# Patient Record
Sex: Female | Born: 1945 | Hispanic: No | Marital: Married
Health system: Southern US, Community
[De-identification: ages and names within clinical notes are randomized; demographics above are authoritative.]

---

## 1998-04-30 ENCOUNTER — Other Ambulatory Visit: Admission: RE | Admit: 1998-04-30 | Discharge: 1998-04-30 | Payer: Self-pay | Admitting: Obstetrics and Gynecology

## 1998-05-01 ENCOUNTER — Other Ambulatory Visit: Admission: RE | Admit: 1998-05-01 | Discharge: 1998-05-01 | Payer: Self-pay | Admitting: Obstetrics and Gynecology

## 2002-11-14 ENCOUNTER — Other Ambulatory Visit: Admission: RE | Admit: 2002-11-14 | Discharge: 2002-11-14 | Payer: Self-pay | Admitting: Obstetrics and Gynecology

## 2004-02-03 ENCOUNTER — Other Ambulatory Visit: Admission: RE | Admit: 2004-02-03 | Discharge: 2004-02-03 | Payer: Self-pay | Admitting: Obstetrics and Gynecology

## 2005-02-02 ENCOUNTER — Other Ambulatory Visit: Admission: RE | Admit: 2005-02-02 | Discharge: 2005-02-02 | Payer: Self-pay | Admitting: Obstetrics and Gynecology

## 2015-03-05 ENCOUNTER — Other Ambulatory Visit: Payer: Self-pay | Admitting: Obstetrics and Gynecology

## 2015-03-05 DIAGNOSIS — R928 Other abnormal and inconclusive findings on diagnostic imaging of breast: Secondary | ICD-10-CM

## 2015-03-10 ENCOUNTER — Ambulatory Visit
Admission: RE | Admit: 2015-03-10 | Discharge: 2015-03-10 | Disposition: A | Discharge status: Home / Self Care | Payer: BLUE CROSS/BLUE SHIELD | Source: Ambulatory Visit | Attending: Obstetrics and Gynecology | Admitting: Obstetrics and Gynecology

## 2015-03-10 DIAGNOSIS — R928 Other abnormal and inconclusive findings on diagnostic imaging of breast: Secondary | ICD-10-CM

## 2016-03-02 ENCOUNTER — Other Ambulatory Visit: Payer: Self-pay | Admitting: Obstetrics & Gynecology

## 2016-03-02 DIAGNOSIS — E2839 Other primary ovarian failure: Secondary | ICD-10-CM

## 2016-03-02 DIAGNOSIS — N63 Unspecified lump in unspecified breast: Secondary | ICD-10-CM

## 2016-03-17 ENCOUNTER — Ambulatory Visit
Admission: RE | Admit: 2016-03-17 | Discharge: 2016-03-17 | Disposition: A | Discharge status: Home / Self Care | Payer: Medicare Other | Source: Ambulatory Visit | Attending: Obstetrics & Gynecology | Admitting: Obstetrics & Gynecology

## 2016-03-17 DIAGNOSIS — N63 Unspecified lump in unspecified breast: Secondary | ICD-10-CM

## 2016-03-17 DIAGNOSIS — E2839 Other primary ovarian failure: Secondary | ICD-10-CM

## 2017-03-21 ENCOUNTER — Other Ambulatory Visit: Payer: Self-pay | Admitting: Obstetrics & Gynecology

## 2017-03-21 DIAGNOSIS — N63 Unspecified lump in unspecified breast: Secondary | ICD-10-CM

## 2017-03-30 ENCOUNTER — Ambulatory Visit: Payer: BLUE CROSS/BLUE SHIELD

## 2017-03-30 ENCOUNTER — Ambulatory Visit
Admission: RE | Admit: 2017-03-30 | Discharge: 2017-03-30 | Disposition: A | Discharge status: Home / Self Care | Payer: Medicare HMO | Source: Ambulatory Visit | Attending: Obstetrics & Gynecology | Admitting: Obstetrics & Gynecology

## 2017-03-30 DIAGNOSIS — N63 Unspecified lump in unspecified breast: Secondary | ICD-10-CM

## 2018-02-13 IMAGING — MG 2D DIGITAL DIAGNOSTIC BILATERAL MAMMOGRAM WITH CAD AND ADJUNCT T
8 of 12 series · 8 of 28 positions shown · non-contrast
Comparison: Previous exam(s).

CLINICAL DATA: Six-month follow-up of the lateral right breast
masses

EXAM:
2D DIGITAL DIAGNOSTIC BILATERAL MAMMOGRAM WITH CAD AND ADJUNCT TOMO

[L CC synth-2D]
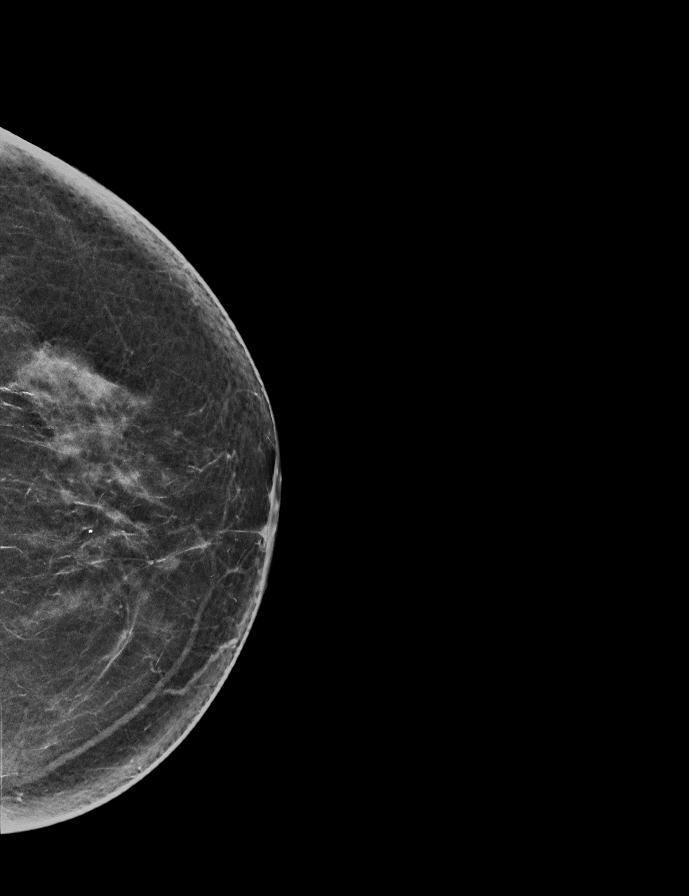

[L MLO]
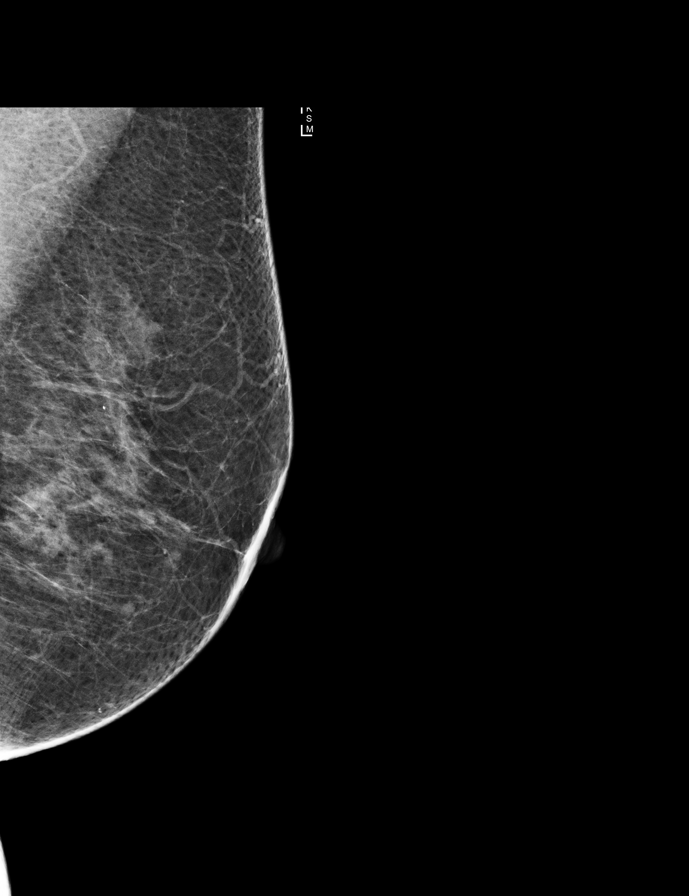

[R MLO]
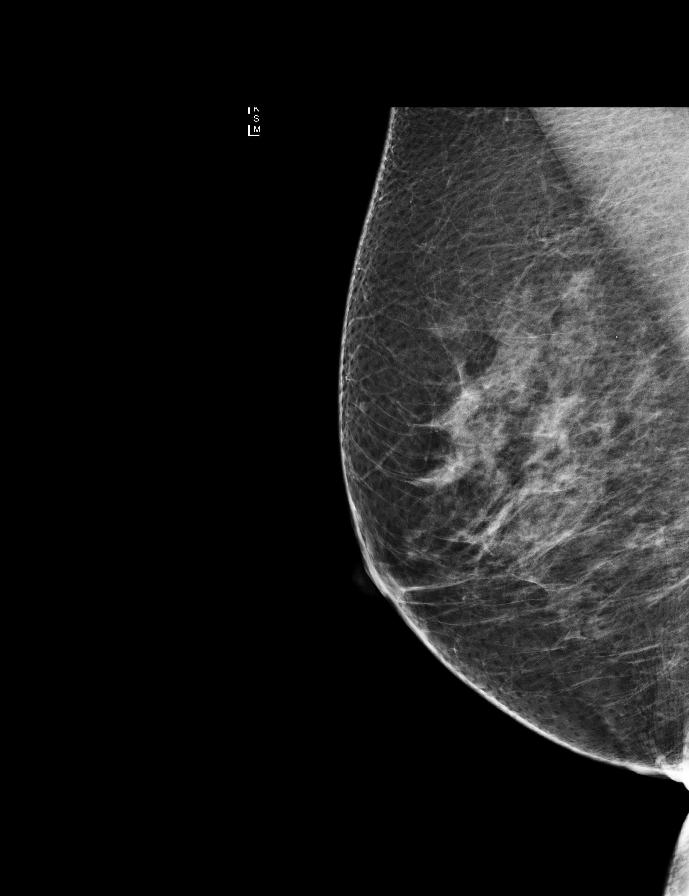

[L MLO synth-2D]
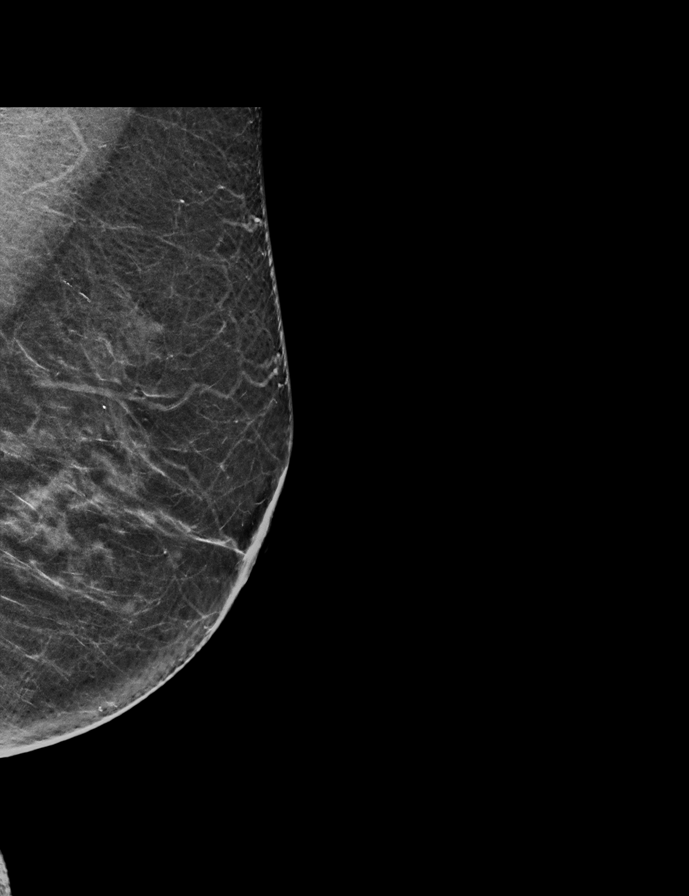

[R CC]
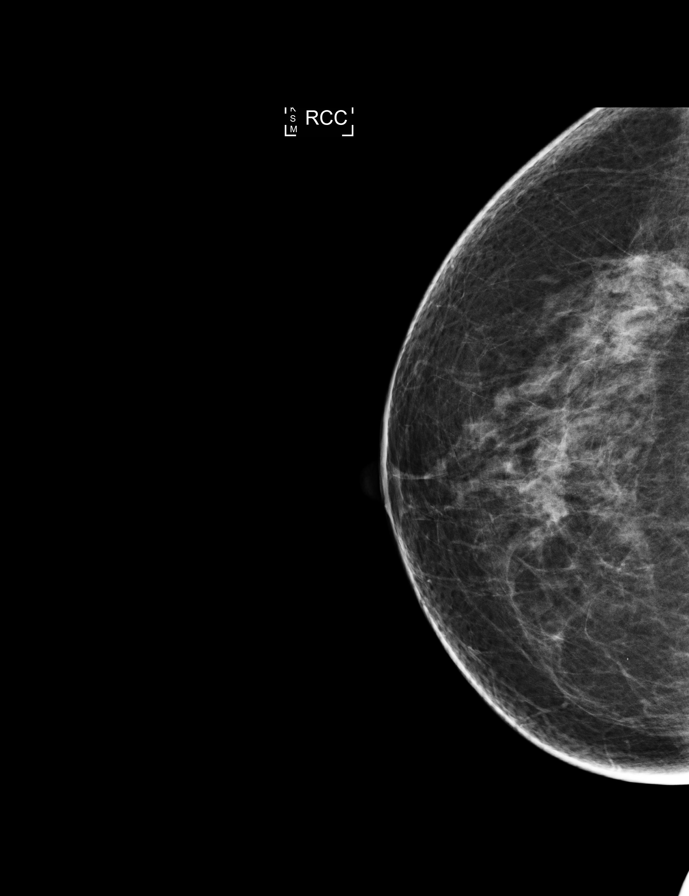

[R MLO synth-2D]
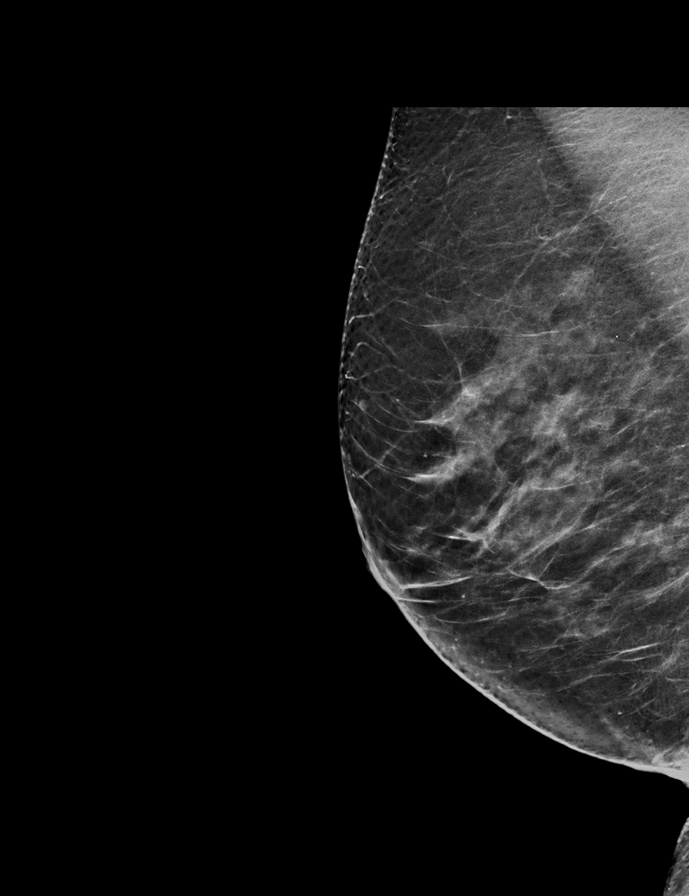

[L CC]
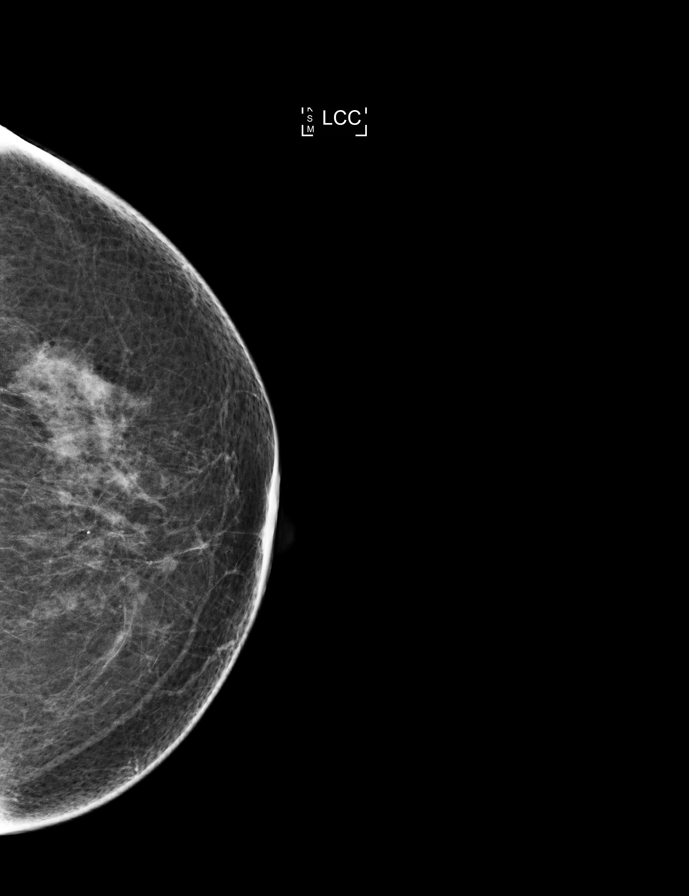

[R CC synth-2D]
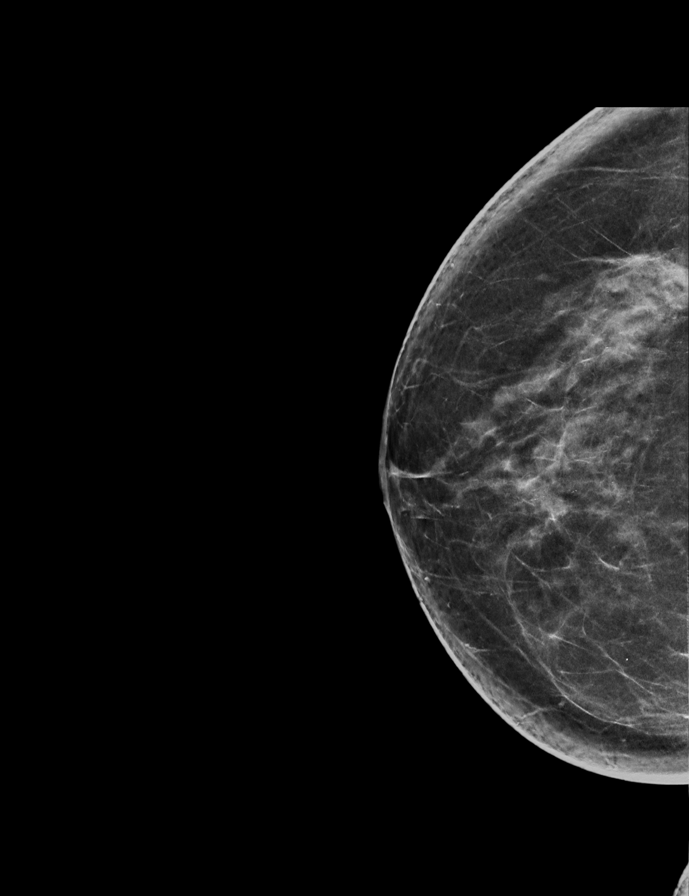

[8 of 28 positions shown; findings below may reference images not displayed]

ACR Breast Density Category c: The breast tissue is heterogeneously
dense, which may obscure small masses.
FINDINGS: The patient has a waxing and waning nodular pattern in both breasts.
The nodularity in the medial left breast is not significantly
changed. The more anterior of the 2 nodules seen a in t the lateral
right breast is smaller in the interval. The more posterior of the 2
nodules is stable. There have been no suspicious interval changes.

Mammographic images were processed with CAD.
IMPRESSION: No suspicious changes in the probably benign right lateral
nodularity. The waxing and waning nodular pattern in both breasts
suggests a benign etiology.

RECOMMENDATION:
Twelve month follow-up mammography of the breasts to ensure
stability of the lateral nodularity on the right. If there are no
suspicious changes at that time, the patient can return to screening
mammography.

I have discussed the findings and recommendations with the patient.
Results were also provided in writing at the conclusion of the
visit. If applicable, a reminder letter will be sent to the patient
regarding the next appointment.

BI-RADS CATEGORY  3: Probably benign.

## 2019-02-26 IMAGING — MG 2D DIGITAL DIAGNOSTIC BILATERAL MAMMOGRAM WITH CAD AND ADJUNCT T
8 of 12 series · 8 of 28 positions shown · non-contrast
Comparison: Previous exam(s).

CLINICAL DATA: Two year follow-up of right breast mass.

EXAM:
2D DIGITAL DIAGNOSTIC BILATERAL MAMMOGRAM WITH CAD AND ADJUNCT TOMO

[R CC]
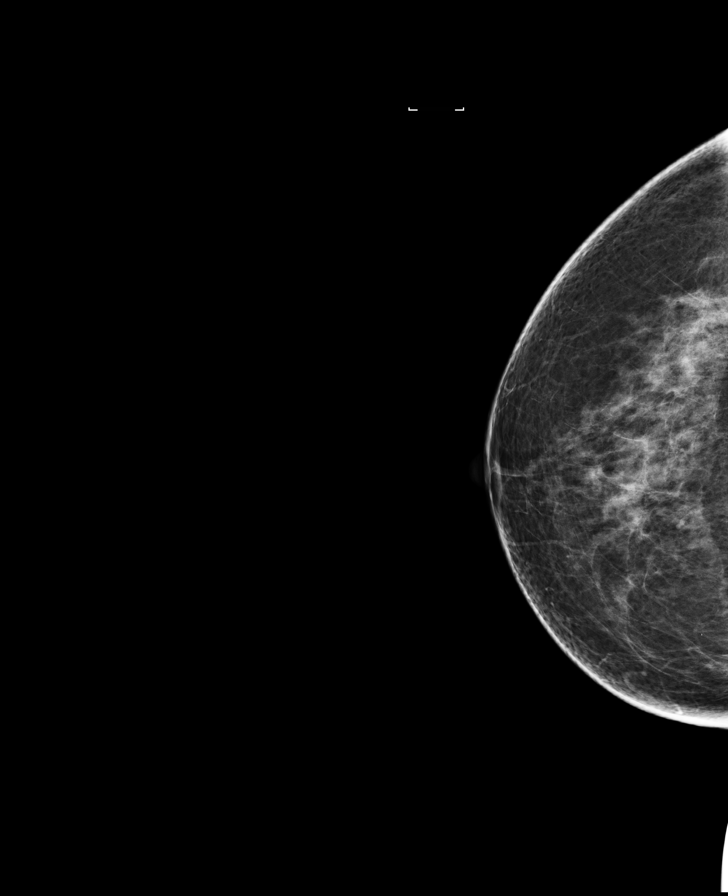

[L CC]
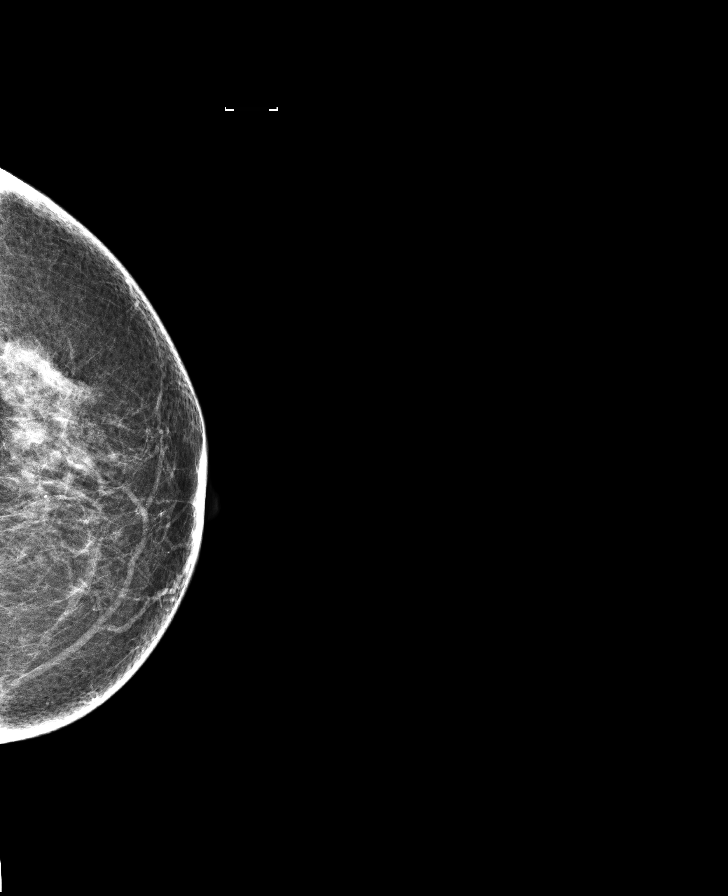

[L CC synth-2D]
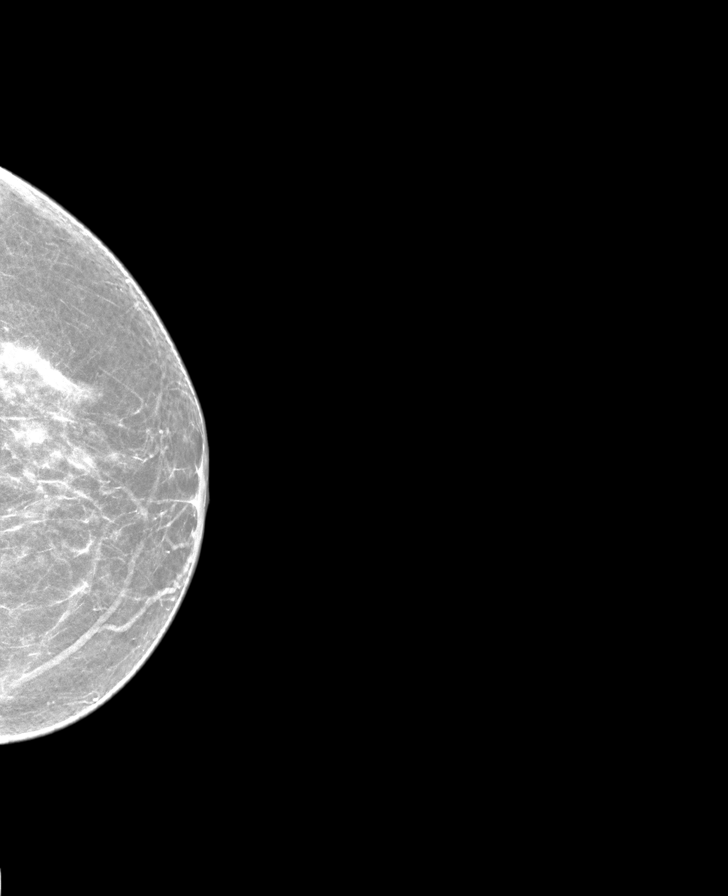

[L MLO synth-2D]
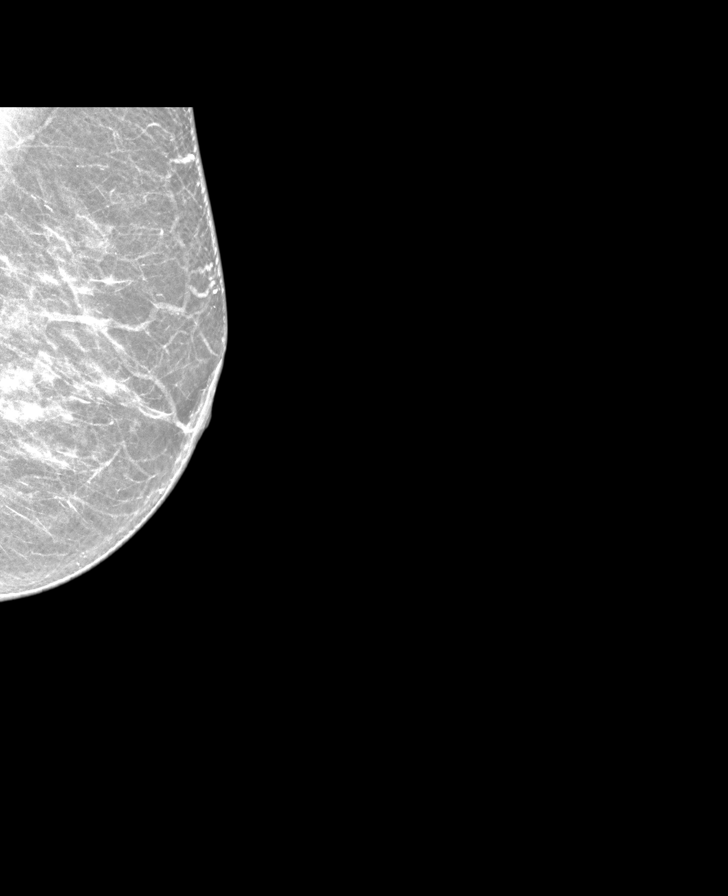

[R MLO synth-2D]
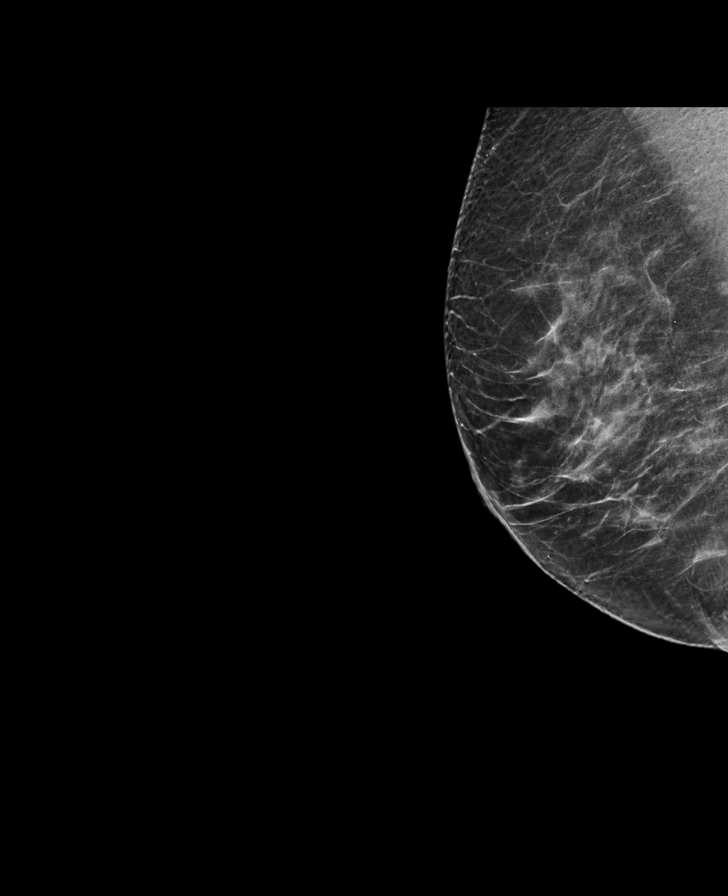

[R MLO]
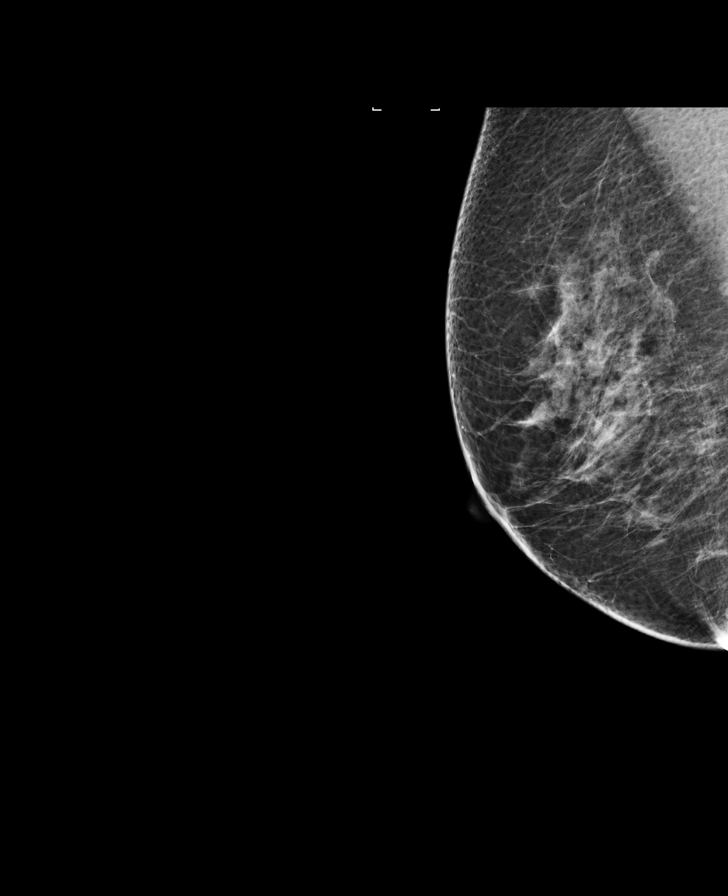

[L MLO]
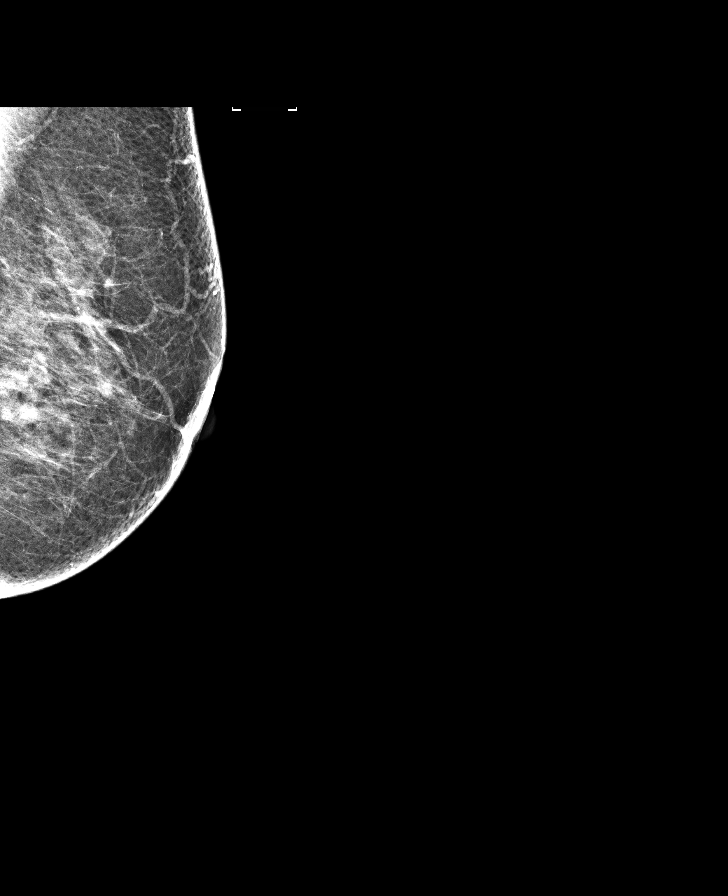

[R CC synth-2D]
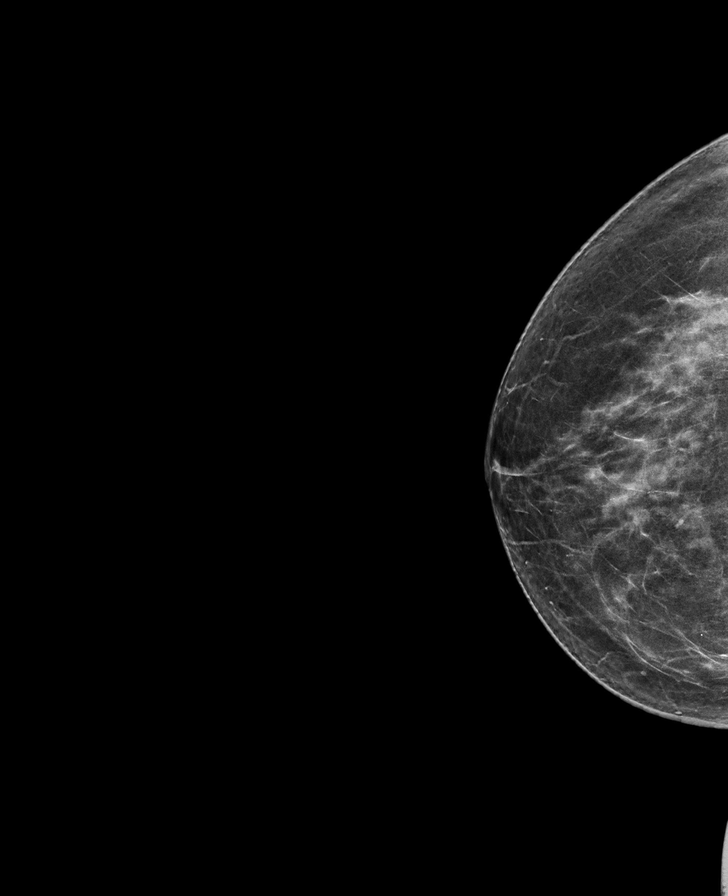

[8 of 28 positions shown; findings below may reference images not displayed]

ACR Breast Density Category c: The breast tissue is heterogeneously
dense, which may obscure small masses.
FINDINGS: The nodularity in the lateral right breast is stable. The patient
has a waxing and waning nodular pattern consistent with a benign
process such as fibrocystic change. No suspicious changes.

Mammographic images were processed with CAD.
IMPRESSION: No mammographic evidence of malignancy.

RECOMMENDATION:
Annual screening mammography.

I have discussed the findings and recommendations with the patient.
Results were also provided in writing at the conclusion of the
visit. If applicable, a reminder letter will be sent to the patient
regarding the next appointment.

BI-RADS CATEGORY  2: Benign.

## 2022-06-02 ENCOUNTER — Emergency Department (HOSPITAL_COMMUNITY): Payer: Medicare HMO

## 2022-06-02 ENCOUNTER — Emergency Department (HOSPITAL_COMMUNITY)
Admission: EM | Admit: 2022-06-02 | Discharge: 2022-06-02 | Disposition: A | Discharge status: Home / Self Care | Payer: Medicare HMO | Attending: Emergency Medicine | Admitting: Emergency Medicine

## 2022-06-02 DIAGNOSIS — M25551 Pain in right hip: Secondary | ICD-10-CM | POA: Insufficient documentation

## 2022-06-02 DIAGNOSIS — S82891A Other fracture of right lower leg, initial encounter for closed fracture: Secondary | ICD-10-CM | POA: Diagnosis not present

## 2022-06-02 DIAGNOSIS — M5136 Other intervertebral disc degeneration, lumbar region: Secondary | ICD-10-CM | POA: Diagnosis not present

## 2022-06-02 DIAGNOSIS — R000 Tachycardia, unspecified: Secondary | ICD-10-CM | POA: Insufficient documentation

## 2022-06-02 DIAGNOSIS — N858 Other specified noninflammatory disorders of uterus: Secondary | ICD-10-CM | POA: Diagnosis not present

## 2022-06-02 DIAGNOSIS — M4312 Spondylolisthesis, cervical region: Secondary | ICD-10-CM | POA: Diagnosis not present

## 2022-06-02 DIAGNOSIS — S92201A Fracture of unspecified tarsal bone(s) of right foot, initial encounter for closed fracture: Secondary | ICD-10-CM | POA: Diagnosis not present

## 2022-06-02 DIAGNOSIS — M79604 Pain in right leg: Secondary | ICD-10-CM | POA: Insufficient documentation

## 2022-06-02 DIAGNOSIS — K449 Diaphragmatic hernia without obstruction or gangrene: Secondary | ICD-10-CM | POA: Diagnosis not present

## 2022-06-02 DIAGNOSIS — R1030 Lower abdominal pain, unspecified: Secondary | ICD-10-CM | POA: Diagnosis not present

## 2022-06-02 DIAGNOSIS — S3991XA Unspecified injury of abdomen, initial encounter: Secondary | ICD-10-CM | POA: Diagnosis not present

## 2022-06-02 DIAGNOSIS — S99921A Unspecified injury of right foot, initial encounter: Secondary | ICD-10-CM | POA: Diagnosis present

## 2022-06-02 DIAGNOSIS — S3210XA Unspecified fracture of sacrum, initial encounter for closed fracture: Secondary | ICD-10-CM | POA: Diagnosis not present

## 2022-06-02 DIAGNOSIS — R930 Abnormal findings on diagnostic imaging of skull and head, not elsewhere classified: Secondary | ICD-10-CM | POA: Diagnosis not present

## 2022-06-02 DIAGNOSIS — S300XXA Contusion of lower back and pelvis, initial encounter: Secondary | ICD-10-CM | POA: Diagnosis not present

## 2022-06-02 DIAGNOSIS — S92124A Nondisplaced fracture of body of right talus, initial encounter for closed fracture: Secondary | ICD-10-CM | POA: Diagnosis not present

## 2022-06-02 DIAGNOSIS — M5137 Other intervertebral disc degeneration, lumbosacral region: Secondary | ICD-10-CM | POA: Diagnosis not present

## 2022-06-02 DIAGNOSIS — S0990XA Unspecified injury of head, initial encounter: Secondary | ICD-10-CM | POA: Diagnosis not present

## 2022-06-02 DIAGNOSIS — S99912A Unspecified injury of left ankle, initial encounter: Secondary | ICD-10-CM | POA: Diagnosis not present

## 2022-06-02 DIAGNOSIS — M7989 Other specified soft tissue disorders: Secondary | ICD-10-CM | POA: Diagnosis not present

## 2022-06-02 DIAGNOSIS — I2510 Atherosclerotic heart disease of native coronary artery without angina pectoris: Secondary | ICD-10-CM | POA: Diagnosis not present

## 2022-06-02 DIAGNOSIS — S199XXA Unspecified injury of neck, initial encounter: Secondary | ICD-10-CM | POA: Diagnosis not present

## 2022-06-02 DIAGNOSIS — M47812 Spondylosis without myelopathy or radiculopathy, cervical region: Secondary | ICD-10-CM | POA: Diagnosis not present

## 2022-06-02 DIAGNOSIS — S299XXA Unspecified injury of thorax, initial encounter: Secondary | ICD-10-CM | POA: Diagnosis not present

## 2022-06-02 DIAGNOSIS — I700 Atherosclerosis of aorta: Secondary | ICD-10-CM | POA: Diagnosis not present

## 2022-06-02 DIAGNOSIS — M40204 Unspecified kyphosis, thoracic region: Secondary | ICD-10-CM | POA: Diagnosis not present

## 2022-06-02 DIAGNOSIS — S99922A Unspecified injury of left foot, initial encounter: Secondary | ICD-10-CM | POA: Diagnosis not present

## 2022-06-02 DIAGNOSIS — S8991XA Unspecified injury of right lower leg, initial encounter: Secondary | ICD-10-CM | POA: Diagnosis not present

## 2022-06-02 DIAGNOSIS — S3993XA Unspecified injury of pelvis, initial encounter: Secondary | ICD-10-CM | POA: Diagnosis not present

## 2022-06-02 DIAGNOSIS — S8251XA Displaced fracture of medial malleolus of right tibia, initial encounter for closed fracture: Secondary | ICD-10-CM | POA: Diagnosis not present

## 2022-06-02 DIAGNOSIS — W309XXA Contact with unspecified agricultural machinery, initial encounter: Secondary | ICD-10-CM

## 2022-06-02 LAB — COMPREHENSIVE METABOLIC PANEL WITH GFR
ALT: 14 U/L (ref 0–44)
AST: 26 U/L (ref 15–41)
Albumin: 3.7 g/dL (ref 3.5–5.0)
Alkaline Phosphatase: 64 U/L (ref 38–126)
Anion gap: 4 — ABNORMAL LOW (ref 5–15)
BUN: 16 mg/dL (ref 8–23)
CO2: 25 mmol/L (ref 22–32)
Calcium: 8.5 mg/dL — ABNORMAL LOW (ref 8.9–10.3)
Chloride: 105 mmol/L (ref 98–111)
Creatinine, Ser: 0.95 mg/dL (ref 0.44–1.00)
GFR, Estimated: >60 mL/min
Glucose, Bld: 138 mg/dL — ABNORMAL HIGH (ref 70–99)
Potassium: 3.6 mmol/L (ref 3.5–5.1)
Sodium: 134 mmol/L — ABNORMAL LOW (ref 135–145)
Total Bilirubin: 0.7 mg/dL (ref 0.3–1.2)
Total Protein: 6.7 g/dL (ref 6.5–8.1)

## 2022-06-02 LAB — CBC
HCT: 40.3 % (ref 36.0–46.0)
Hemoglobin: 13.3 g/dL (ref 12.0–15.0)
MCH: 31.6 pg (ref 26.0–34.0)
MCHC: 33 g/dL (ref 30.0–36.0)
MCV: 95.7 fL (ref 80.0–100.0)
Platelets: 237 K/uL (ref 150–400)
RBC: 4.21 MIL/uL (ref 3.87–5.11)
RDW: 12.4 % (ref 11.5–15.5)
WBC: 16.9 K/uL — ABNORMAL HIGH (ref 4.0–10.5)
nRBC: 0 % (ref 0.0–0.2)

## 2022-06-02 LAB — I-STAT CHEM 8, ED
BUN: 19 mg/dL (ref 8–23)
Calcium, Ion: 1.13 mmol/L — ABNORMAL LOW (ref 1.15–1.40)
Chloride: 103 mmol/L (ref 98–111)
Creatinine, Ser: 0.8 mg/dL (ref 0.44–1.00)
Glucose, Bld: 136 mg/dL — ABNORMAL HIGH (ref 70–99)
HCT: 40 % (ref 36.0–46.0)
Hemoglobin: 13.6 g/dL (ref 12.0–15.0)
Potassium: 3.8 mmol/L (ref 3.5–5.1)
Sodium: 139 mmol/L (ref 135–145)
TCO2: 25 mmol/L (ref 22–32)

## 2022-06-02 LAB — PROTIME-INR
INR: 1 (ref 0.8–1.2)
Prothrombin Time: 13.1 s (ref 11.4–15.2)

## 2022-06-02 LAB — ETHANOL: Alcohol, Ethyl (B): <10 mg/dL

## 2022-06-02 LAB — LACTIC ACID, PLASMA: Lactic Acid, Venous: 2.2 mmol/L (ref 0.5–1.9)

## 2022-06-02 MED ORDER — ONDANSETRON HCL 4 MG/2ML IJ SOLN: 4.0000 mg | Freq: Once | INTRAMUSCULAR | Status: DC

## 2022-06-02 MED ORDER — IOHEXOL 350 MG/ML SOLN
70.0000 mL | Freq: Once | INTRAVENOUS | Status: AC | PRN
  Administered 2022-06-02: 70 mL via INTRAVENOUS

## 2022-06-02 MED ORDER — ACETAMINOPHEN 500 MG PO TABS: 1000.0000 mg | ORAL_TABLET | Freq: Once | ORAL | Status: DC

## 2022-06-02 MED ORDER — SODIUM CHLORIDE 0.9 % IV BOLUS
125.0000 mL | Freq: Once | INTRAVENOUS | Status: AC
  Administered 2022-06-02: 125 mL via INTRAVENOUS

## 2022-06-02 MED ORDER — HYDROCODONE-ACETAMINOPHEN 5-325 MG PO TABS: 1.0000 | ORAL_TABLET | ORAL | 0 refills | Status: AC | PRN

## 2022-06-02 MED ORDER — FENTANYL CITRATE PF 50 MCG/ML IJ SOSY
50.0000 ug | PREFILLED_SYRINGE | Freq: Once | INTRAMUSCULAR | Status: AC
  Administered 2022-06-02: 50 ug via INTRAVENOUS
  Filled 2022-06-02: qty 1
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
## 2022-06-02 NOTE — Discharge Instructions (Addendum)
You were given a prescription for pain medication, please be aware this medication contains Tylenol, please do not take additional Tylenol while taking this medication.  You may take ibuprofen to help with pain control.  Your right foot was placed on a splint, you need to remain nonweightbearing until you follow-up with orthopedics.  Orthopedics specialist phone number is attached to your chart, please schedule an appointment at your earliest convenience.
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
## 2022-06-03 NOTE — Progress Notes (Signed)
Orthopedic Tech Progress Note Patient Details:  Marissa Young 1945-09-09 [id number]  Ortho Devices Type of Ortho Device: Crutches, Post (short leg) splint, Stirrup splint Ortho Device/Splint Location: rle posterior and stirrup Ortho Device/Splint Interventions: Ordered, Application, Adjustment  The patient was able to get and walk a few steps properly with the crutches. Post Interventions Patient Tolerated: Well Instructions Provided: Care of device  Karolee Stamps 06/03/2022, 12:19 AM

## 2022-10-26 ENCOUNTER — Emergency Department (HOSPITAL_COMMUNITY)
Admission: EM | Admit: 2022-10-26 | Discharge: 2022-10-26 | Disposition: A | Discharge status: Home / Self Care | Payer: Medicare HMO | Attending: Emergency Medicine | Admitting: Emergency Medicine

## 2022-10-26 ENCOUNTER — Other Ambulatory Visit: Payer: Self-pay

## 2022-10-26 ENCOUNTER — Emergency Department (HOSPITAL_COMMUNITY): Payer: Medicare HMO

## 2022-10-26 DIAGNOSIS — S82141A Displaced bicondylar fracture of right tibia, initial encounter for closed fracture: Secondary | ICD-10-CM | POA: Insufficient documentation

## 2022-10-26 DIAGNOSIS — W010XXA Fall on same level from slipping, tripping and stumbling without subsequent striking against object, initial encounter: Secondary | ICD-10-CM | POA: Diagnosis not present

## 2022-10-26 DIAGNOSIS — Y9301 Activity, walking, marching and hiking: Secondary | ICD-10-CM | POA: Insufficient documentation

## 2022-10-26 DIAGNOSIS — W19XXXA Unspecified fall, initial encounter: Secondary | ICD-10-CM

## 2022-10-26 DIAGNOSIS — M25561 Pain in right knee: Secondary | ICD-10-CM | POA: Diagnosis present

## 2022-10-26 NOTE — ED Notes (Signed)
Pt was provided ice chips. Pt states she is fine at the moment.

## 2022-10-26 NOTE — ED Notes (Signed)
RN contacted Ortho for assistance with knee immobilzer
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
## 2022-10-26 NOTE — ED Notes (Signed)
NT assisting pt on bedpan

## 2022-10-26 NOTE — ED Notes (Signed)
Pt provided wheelchair. RN reviewed d/c paperwork with pt and spouse. No additional questions and verbalized understanding from pt and spouse
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
## 2022-10-26 NOTE — ED Notes (Signed)
Transported to CT 

## 2022-10-26 NOTE — ED Triage Notes (Signed)
Pt arrived to triage complaining right knee pain after a fall that occurred yesterday. Pt states that her spouse tripped and fell on top of her OTC meds without relief.  No open wounds   Denies LOC or hitting her head

## 2022-10-26 NOTE — Discharge Planning (Signed)
    Durable Medical Equipment  (From admission, onward)           Start     Ordered   10/26/22 1110  For home use only DME standard manual wheelchair with seat cushion  Once       Comments: Patient suffers from tibial plateau fracture which impairs their ability to perform daily activities like bathing, dressing, grooming, and toileting in the home.  A cane, crutch, or walker will not resolve issue with performing activities of daily living. A wheelchair will allow patient to safely perform daily activities. Patient can safely propel the wheelchair in the home or has a caregiver who can provide assistance. Length of need 6 months . Accessories: elevating leg rests (ELRs), wheel locks, extensions and anti-tippers.   10/26/22 1110

## 2022-10-26 NOTE — ED Notes (Signed)
Pt stated she is ready to leave d/t it taking a long time to get a wheelchair. Pt spouse asked if he can have wheelchair removed from their account and get a wheelchair himself. RN informed pt the provider is trying to figure out what is going on and pt needs a wheelchair d/t non-weight bearing.   RN left vm for social work.

## 2022-10-26 NOTE — Discharge Instructions (Addendum)
It was a pleasure taking care of you today!  Your xray and CT show concerns for fracture to the lateral tibial plateau on the right side.  At home you may take over-the-counter 500 mg Tylenol every 6 hours and alternate with 600 mg ibuprofen every 6 hours as needed for your symptoms.  You will be placed in a knee immobilizer, it is important that you keep this arm throughout the day and night to keep your leg stable and immobile.  You may remove it to bathe.  You will also be provided with a wheelchair to aid with your daily activities.   It is important that you do not walk or bear weight on your right leg.  Call your orthopedist, Dr. Ephraim Hamburger today to set up an appointment in 2 weeks.   You may follow-up with your primary care provider as needed.  Return to the emergency department if you experience increasing/worsening symptoms.

## 2022-10-26 NOTE — Progress Notes (Signed)
Orthopedic Tech Progress Note Patient Details:  Marissa Young March 09, 1946 [id number]  Ortho Devices Type of Ortho Device: Knee Immobilizer Ortho Device/Splint Location: RLE Ortho Device/Splint Interventions: Ordered, Application, Adjustment   Post Interventions Patient Tolerated: Well Instructions Provided: Care of device, Adjustment of device  [name] 10/26/2022, 11:01 AM

## 2022-11-08 DIAGNOSIS — M25561 Pain in right knee: Secondary | ICD-10-CM | POA: Diagnosis not present

## 2022-11-24 DIAGNOSIS — S82101A Unspecified fracture of upper end of right tibia, initial encounter for closed fracture: Secondary | ICD-10-CM | POA: Diagnosis not present

## 2022-12-06 DIAGNOSIS — M25561 Pain in right knee: Secondary | ICD-10-CM | POA: Diagnosis not present

## 2022-12-25 DIAGNOSIS — S82101A Unspecified fracture of upper end of right tibia, initial encounter for closed fracture: Secondary | ICD-10-CM | POA: Diagnosis not present

## 2023-02-09 DIAGNOSIS — M7061 Trochanteric bursitis, right hip: Secondary | ICD-10-CM | POA: Diagnosis not present

## 2023-03-17 DIAGNOSIS — E785 Hyperlipidemia, unspecified: Secondary | ICD-10-CM | POA: Diagnosis not present

## 2023-03-17 DIAGNOSIS — Z1231 Encounter for screening mammogram for malignant neoplasm of breast: Secondary | ICD-10-CM | POA: Diagnosis not present

## 2023-03-17 DIAGNOSIS — N3281 Overactive bladder: Secondary | ICD-10-CM | POA: Diagnosis not present

## 2023-03-17 DIAGNOSIS — Z1331 Encounter for screening for depression: Secondary | ICD-10-CM | POA: Diagnosis not present

## 2023-03-17 DIAGNOSIS — N3941 Urge incontinence: Secondary | ICD-10-CM | POA: Diagnosis not present

## 2023-03-17 DIAGNOSIS — Z9181 History of falling: Secondary | ICD-10-CM | POA: Diagnosis not present

## 2023-03-17 DIAGNOSIS — K219 Gastro-esophageal reflux disease without esophagitis: Secondary | ICD-10-CM | POA: Diagnosis not present

## 2023-03-17 DIAGNOSIS — E039 Hypothyroidism, unspecified: Secondary | ICD-10-CM | POA: Diagnosis not present

## 2023-03-17 DIAGNOSIS — M8589 Other specified disorders of bone density and structure, multiple sites: Secondary | ICD-10-CM | POA: Diagnosis not present

## 2023-04-06 DIAGNOSIS — Z1231 Encounter for screening mammogram for malignant neoplasm of breast: Secondary | ICD-10-CM | POA: Diagnosis not present

## 2023-04-06 DIAGNOSIS — M8589 Other specified disorders of bone density and structure, multiple sites: Secondary | ICD-10-CM | POA: Diagnosis not present

## 2023-04-18 DIAGNOSIS — M7061 Trochanteric bursitis, right hip: Secondary | ICD-10-CM | POA: Diagnosis not present

## 2023-04-18 DIAGNOSIS — M25571 Pain in right ankle and joints of right foot: Secondary | ICD-10-CM | POA: Diagnosis not present

## 2023-07-03 DIAGNOSIS — L720 Epidermal cyst: Secondary | ICD-10-CM | POA: Diagnosis not present

## 2023-07-03 DIAGNOSIS — D225 Melanocytic nevi of trunk: Secondary | ICD-10-CM | POA: Diagnosis not present

## 2023-07-03 DIAGNOSIS — D2272 Melanocytic nevi of left lower limb, including hip: Secondary | ICD-10-CM | POA: Diagnosis not present

## 2023-07-03 DIAGNOSIS — R202 Paresthesia of skin: Secondary | ICD-10-CM | POA: Diagnosis not present

## 2023-07-03 DIAGNOSIS — D2261 Melanocytic nevi of right upper limb, including shoulder: Secondary | ICD-10-CM | POA: Diagnosis not present

## 2023-07-03 DIAGNOSIS — D2262 Melanocytic nevi of left upper limb, including shoulder: Secondary | ICD-10-CM | POA: Diagnosis not present

## 2023-07-03 DIAGNOSIS — D2271 Melanocytic nevi of right lower limb, including hip: Secondary | ICD-10-CM | POA: Diagnosis not present

## 2023-07-03 DIAGNOSIS — L900 Lichen sclerosus et atrophicus: Secondary | ICD-10-CM | POA: Diagnosis not present

## 2023-07-03 DIAGNOSIS — L821 Other seborrheic keratosis: Secondary | ICD-10-CM | POA: Diagnosis not present

## 2023-07-17 DIAGNOSIS — Z6825 Body mass index (BMI) 25.0-25.9, adult: Secondary | ICD-10-CM | POA: Diagnosis not present

## 2023-07-17 DIAGNOSIS — Z23 Encounter for immunization: Secondary | ICD-10-CM | POA: Diagnosis not present

## 2023-07-17 DIAGNOSIS — H6120 Impacted cerumen, unspecified ear: Secondary | ICD-10-CM | POA: Diagnosis not present

## 2023-07-17 DIAGNOSIS — H9193 Unspecified hearing loss, bilateral: Secondary | ICD-10-CM | POA: Diagnosis not present

## 2023-09-19 DIAGNOSIS — E785 Hyperlipidemia, unspecified: Secondary | ICD-10-CM | POA: Diagnosis not present

## 2023-09-19 DIAGNOSIS — N3281 Overactive bladder: Secondary | ICD-10-CM | POA: Diagnosis not present

## 2023-09-19 DIAGNOSIS — N3941 Urge incontinence: Secondary | ICD-10-CM | POA: Diagnosis not present

## 2023-09-19 DIAGNOSIS — Z6825 Body mass index (BMI) 25.0-25.9, adult: Secondary | ICD-10-CM | POA: Diagnosis not present

## 2023-09-19 DIAGNOSIS — M8580 Other specified disorders of bone density and structure, unspecified site: Secondary | ICD-10-CM | POA: Diagnosis not present

## 2023-09-19 DIAGNOSIS — K219 Gastro-esophageal reflux disease without esophagitis: Secondary | ICD-10-CM | POA: Diagnosis not present

## 2023-09-19 DIAGNOSIS — E039 Hypothyroidism, unspecified: Secondary | ICD-10-CM | POA: Diagnosis not present

## 2024-01-12 DIAGNOSIS — M5136 Other intervertebral disc degeneration, lumbar region with discogenic back pain only: Secondary | ICD-10-CM | POA: Diagnosis not present

## 2024-01-16 DIAGNOSIS — Z0000 Encounter for general adult medical examination without abnormal findings: Secondary | ICD-10-CM | POA: Diagnosis not present

## 2024-01-16 DIAGNOSIS — Z9181 History of falling: Secondary | ICD-10-CM | POA: Diagnosis not present

## 2024-03-28 DIAGNOSIS — M79671 Pain in right foot: Secondary | ICD-10-CM | POA: Diagnosis not present

## 2024-03-28 DIAGNOSIS — N3281 Overactive bladder: Secondary | ICD-10-CM | POA: Diagnosis not present

## 2024-03-28 DIAGNOSIS — E785 Hyperlipidemia, unspecified: Secondary | ICD-10-CM | POA: Diagnosis not present

## 2024-03-28 DIAGNOSIS — M8580 Other specified disorders of bone density and structure, unspecified site: Secondary | ICD-10-CM | POA: Diagnosis not present

## 2024-03-28 DIAGNOSIS — Z6824 Body mass index (BMI) 24.0-24.9, adult: Secondary | ICD-10-CM | POA: Diagnosis not present

## 2024-03-28 DIAGNOSIS — K219 Gastro-esophageal reflux disease without esophagitis: Secondary | ICD-10-CM | POA: Diagnosis not present

## 2024-03-28 DIAGNOSIS — N3941 Urge incontinence: Secondary | ICD-10-CM | POA: Diagnosis not present

## 2024-03-28 DIAGNOSIS — E039 Hypothyroidism, unspecified: Secondary | ICD-10-CM | POA: Diagnosis not present

## 2024-03-28 DIAGNOSIS — Z79899 Other long term (current) drug therapy: Secondary | ICD-10-CM | POA: Diagnosis not present

## 2024-04-05 DIAGNOSIS — Z008 Encounter for other general examination: Secondary | ICD-10-CM | POA: Diagnosis not present

## 2024-07-22 DIAGNOSIS — K219 Gastro-esophageal reflux disease without esophagitis: Secondary | ICD-10-CM | POA: Diagnosis not present

## 2024-07-23 DIAGNOSIS — H25813 Combined forms of age-related cataract, bilateral: Secondary | ICD-10-CM | POA: Diagnosis not present
